# Patient Record
Sex: Male | Born: 1955 | Race: White | Hispanic: No | Smoking: Current some day smoker
Health system: Southern US, Community
[De-identification: ages and names within clinical notes are randomized; demographics above are authoritative.]

## PROBLEM LIST (undated history)

## (undated) DIAGNOSIS — M1990 Unspecified osteoarthritis, unspecified site: Secondary | ICD-10-CM

## (undated) HISTORY — DX: Unspecified osteoarthritis, unspecified site: M19.90

---

## 1973-09-15 HISTORY — PX: FACIAL LACERATION REPAIR: SHX6589

## 2000-05-13 ENCOUNTER — Ambulatory Visit (HOSPITAL_COMMUNITY): Admission: RE | Admit: 2000-05-13 | Discharge: 2000-05-13 | Payer: Self-pay | Admitting: Gastroenterology

## 2004-06-01 ENCOUNTER — Emergency Department (HOSPITAL_COMMUNITY): Admission: EM | Admit: 2004-06-01 | Discharge: 2004-06-01 | Payer: Self-pay | Admitting: Emergency Medicine

## 2005-09-30 IMAGING — CT CT HEAD W/O CM
1 of 2 series · 13 of 30 positions shown, 17 images · non-contrast
Comparison: none

CLINICAL DATA: Head injury. 
 CT OF THE HEAD WITHOUT IV CONTRAST
 There are no midline shift or mass effect, and the ventricles are normal in size and contour.  There is no CT scan evidence for recent stroke or hemorrhage.  There is mild mucosal thickening associated with the left maxillary sinus. 
 IMPRESSION
 Mild mucosal thickening associated with the left maxillary sinus.  Otherwise normal study.

[Series 3: — · axial · 0.45mm/px · z∈[-120,-0]mm · 13 of 28 slices shown, 17 images]
[im 2/28  brain]
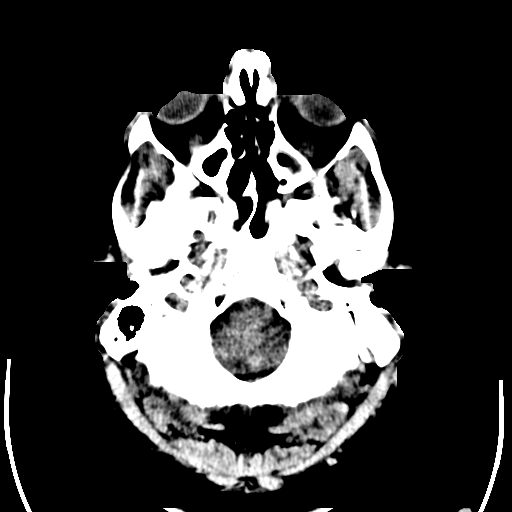
[im 2/28  bone]
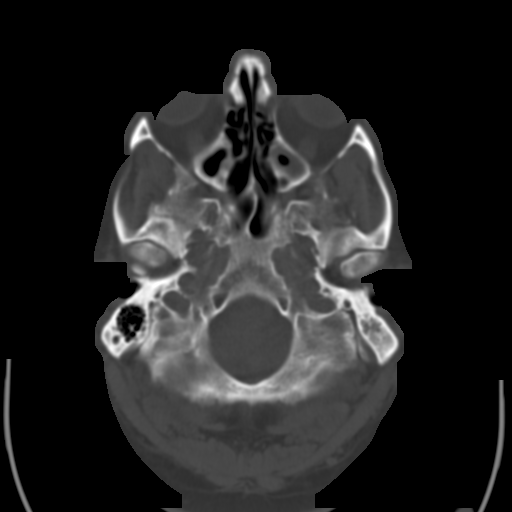
[im 4/28  brain]
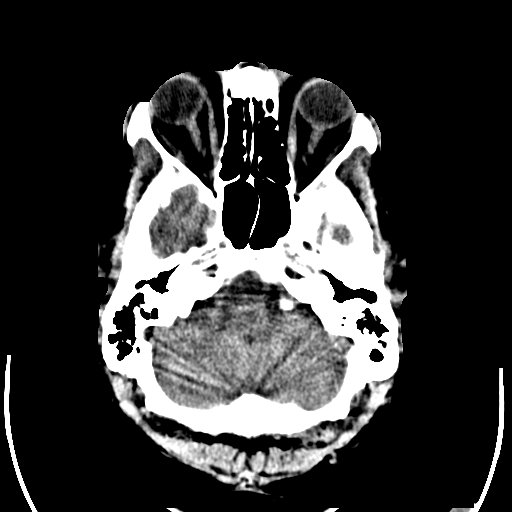
[im 6/28  brain]
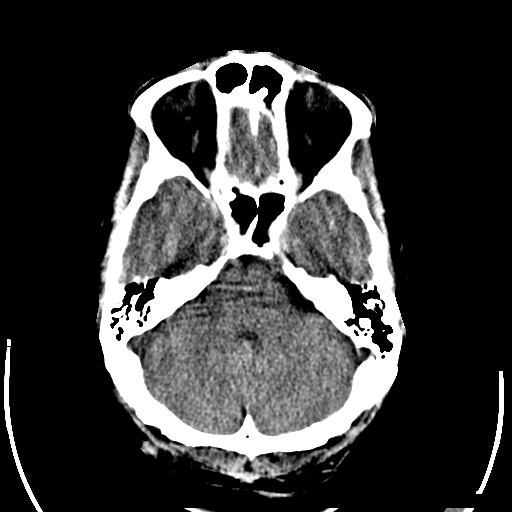
[im 8/28  brain]
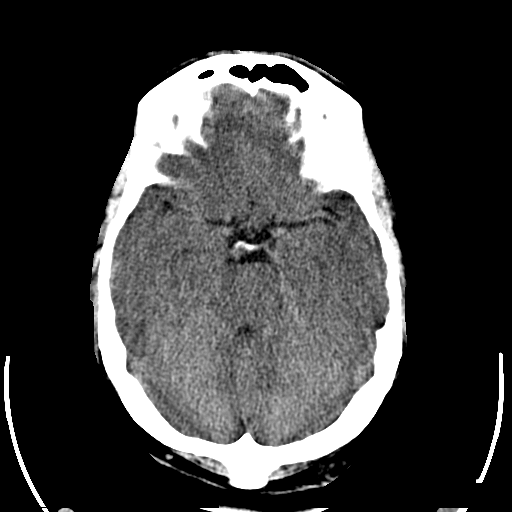
[im 10/28  brain]
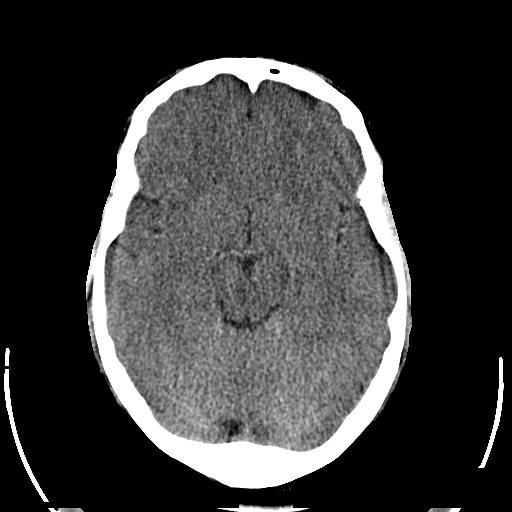
[im 10/28  bone]
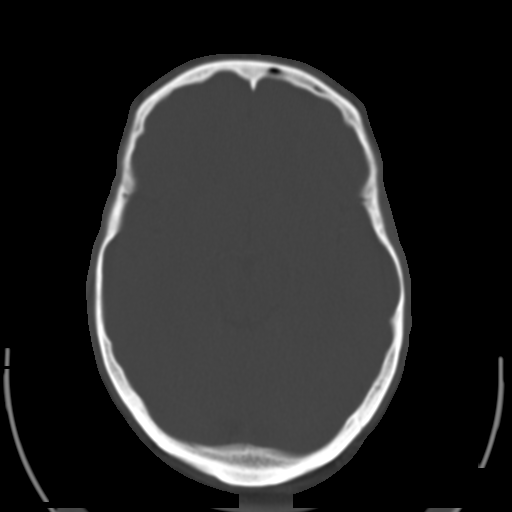
[im 12/28  brain]
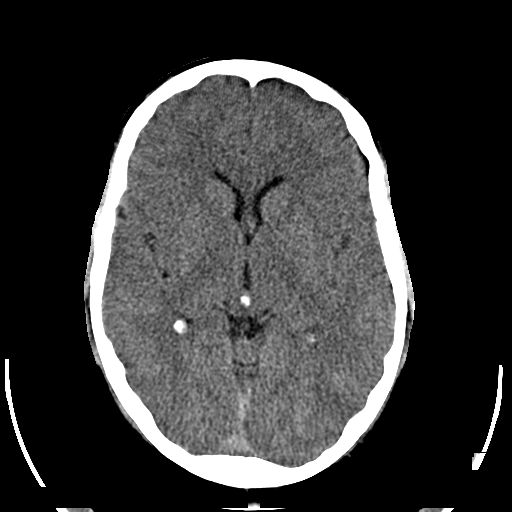
[im 14/28  brain]
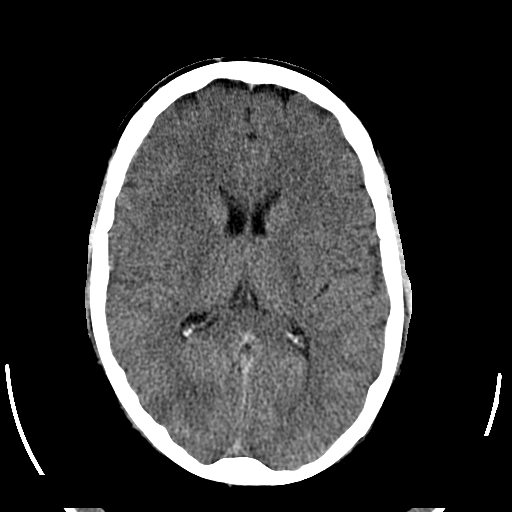
[im 16/28  brain]
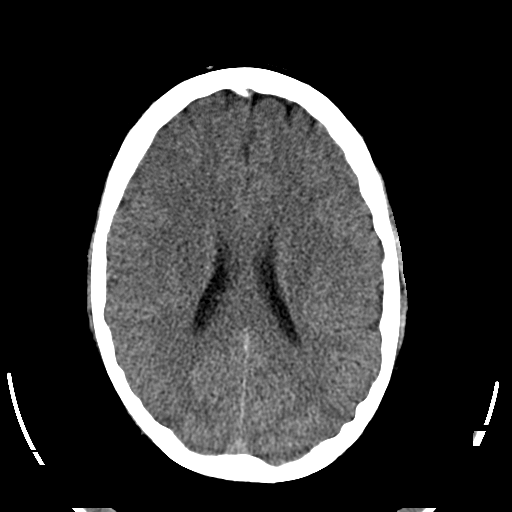
[im 18/28  brain]
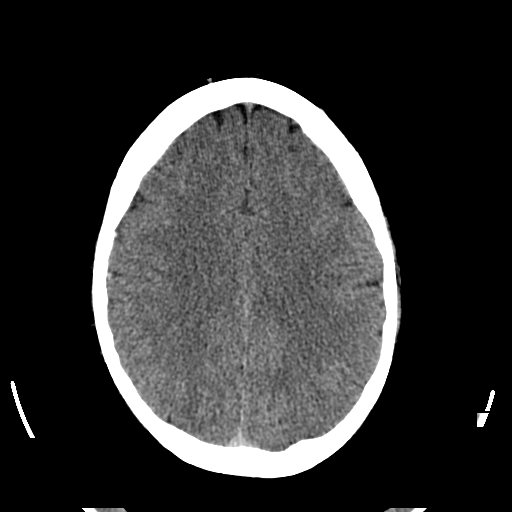
[im 18/28  bone]
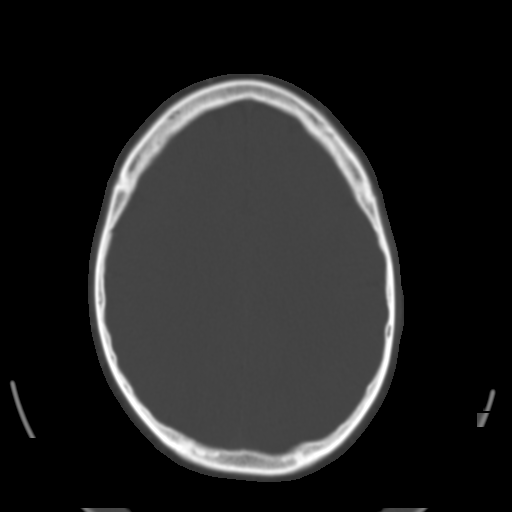
[im 20/28  brain]
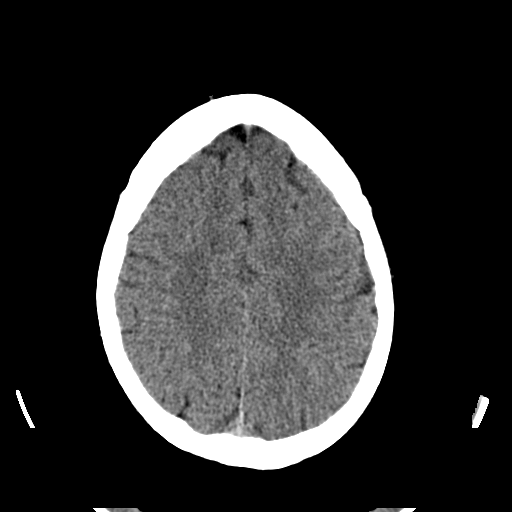
[im 22/28  brain]
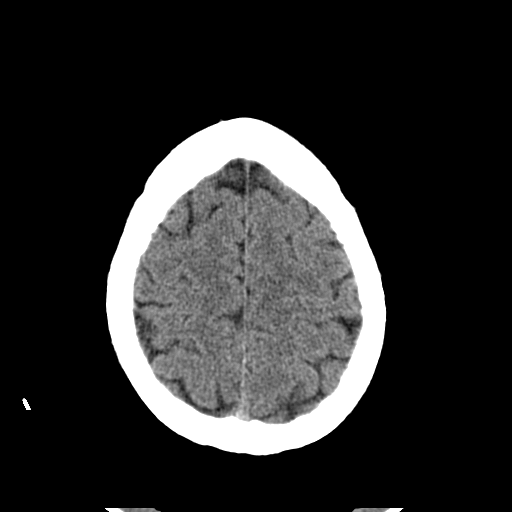
[im 24/28  brain]
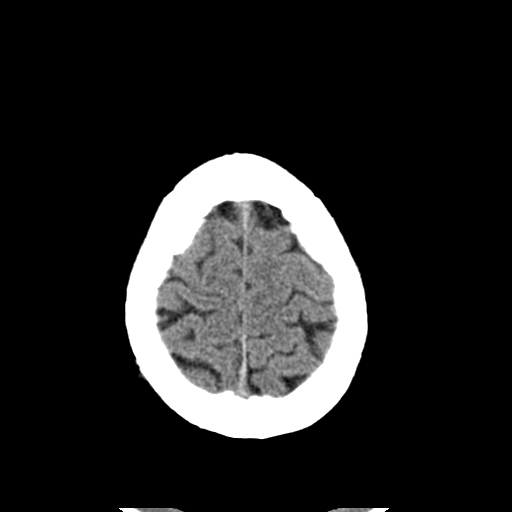
[im 26/28  brain]
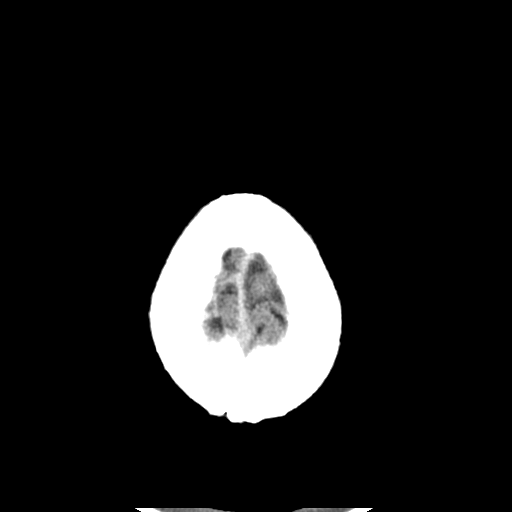
[im 26/28  bone]
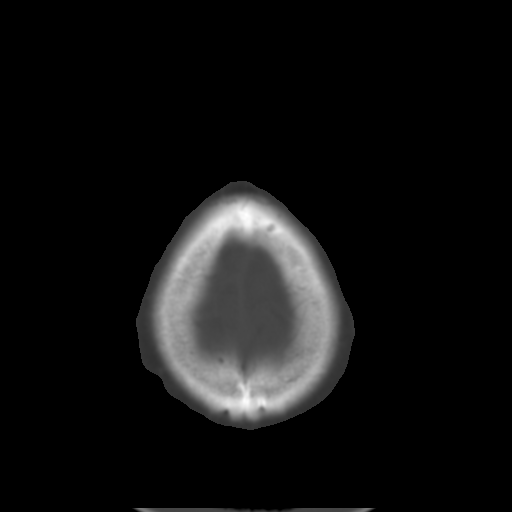

[13 of 30 positions shown; findings below may reference images not displayed]

## 2006-12-01 ENCOUNTER — Ambulatory Visit: Payer: Self-pay | Admitting: Gastroenterology

## 2006-12-07 ENCOUNTER — Ambulatory Visit: Payer: Self-pay | Admitting: Gastroenterology

## 2009-10-16 ENCOUNTER — Encounter (INDEPENDENT_AMBULATORY_CARE_PROVIDER_SITE_OTHER): Payer: Self-pay | Admitting: *Deleted

## 2010-10-15 NOTE — Letter (Signed)
Summary: Colonoscopy Date Change Letter  [facility]  [address] [place], [place] [address]   Phone: [phone]  Fax: [phone]      October 16, 2009 MRN: [id number]   Ernest Thornton [address] [place], [place]  [address]   Dear Ernest Thornton,   Previously you were recommended to have a repeat colonoscopy around this time. Your chart was recently reviewed by Dr. Vania Rea. Jarold Motto of [facility]. Follow up colonoscopy is now recommended in March 2013. This revised recommendation is based on current, nationally recognized guidelines for colorectal cancer screening and polyp surveillance. These guidelines are endorsed by the American Cancer Society, [employer] on Colorectal Cancer as well as numerous other major medical organizations.  Please understand that our recommendation assumes that you do not have any new symptoms such as bleeding, a change in bowel habits, anemia, or significant abdominal discomfort. If you do have any concerning GI symptoms or want to discuss the guideline recommendations, please call to arrange an office visit at your earliest convenience. Otherwise we will keep you in our reminder system and contact you 1-2 months prior to the date listed above to schedule your next colonoscopy.  Thank you,  Vania Rea. Jarold Motto, M.D.  [facility] [phone]

## 2011-12-08 ENCOUNTER — Encounter: Payer: Self-pay | Admitting: Gastroenterology

## 2011-12-29 ENCOUNTER — Encounter: Payer: Self-pay | Admitting: Gastroenterology

## 2012-01-21 ENCOUNTER — Ambulatory Visit (AMBULATORY_SURGERY_CENTER): Payer: No Typology Code available for payment source | Admitting: *Deleted

## 2012-01-21 VITALS — Ht 72.0 in | Wt 264.0 lb

## 2012-01-21 DIAGNOSIS — Z1211 Encounter for screening for malignant neoplasm of colon: Secondary | ICD-10-CM

## 2012-01-21 MED ORDER — PEG-KCL-NACL-NASULF-NA ASC-C 100 G PO SOLR: ORAL | Status: DC

## 2012-02-04 ENCOUNTER — Ambulatory Visit (AMBULATORY_SURGERY_CENTER): Payer: No Typology Code available for payment source | Admitting: Gastroenterology

## 2012-02-04 ENCOUNTER — Encounter: Payer: Self-pay | Admitting: Gastroenterology

## 2012-02-04 VITALS — BP 140/92 | HR 78 | Temp 96.2°F | Resp 20 | Ht 72.0 in | Wt 264.0 lb

## 2012-02-04 DIAGNOSIS — Z860100 Personal history of colon polyps, unspecified: Secondary | ICD-10-CM

## 2012-02-04 DIAGNOSIS — Z1211 Encounter for screening for malignant neoplasm of colon: Secondary | ICD-10-CM

## 2012-02-04 DIAGNOSIS — Z800 Family history of malignant neoplasm of digestive organs: Secondary | ICD-10-CM

## 2012-02-04 MED ORDER — SODIUM CHLORIDE 0.9 % IV SOLN: 500.0000 mL | INTRAVENOUS | Status: DC

## 2012-02-04 NOTE — Op Note (Signed)
[facility] [address] [place], [place]  [address]  COLONOSCOPY PROCEDURE REPORT  PATIENT:  Ernest Thornton, Ernest Thornton  MR#:  [id number] BIRTHDATE:  [birth year], 56 yrs. old  GENDER:  male ENDOSCOPIST:  Vania Rea. Jarold Motto, MD, [facility] REF. BY: PROCEDURE DATE:  02/04/2012 PROCEDURE:  Diagnostic Colonoscopy ASA CLASS:  Class II INDICATIONS:  history of pre-cancerous (adenomatous) colon polyps father with colon cancer MEDICATIONS:   propofol (Diprivan) 175 mg IV  DESCRIPTION OF PROCEDURE:   After the risks and benefits and of the procedure were explained, informed consent was obtained. Digital rectal exam was performed and revealed no abnormalities. The LB [id number]-[id number] endoscope was introduced through the anus and advanced to the cecum, which was identified by both the appendix and ileocecal valve.  The quality of the prep was excellent, using MoviPrep.  The instrument was then slowly withdrawn as the colon was fully examined. <<PROCEDUREIMAGES>>  FINDINGS:  No polyps or cancers were seen. diminutive hyperplastic nodules noted.  This was otherwise a normal examination of the colon.   Retroflexed views in the rectum revealed no abnormalities.    The scope was then withdrawn from the patient and the procedure completed.  COMPLICATIONS:  None ENDOSCOPIC IMPRESSION: 1) No polyps or cancers 2) Otherwise normal examination RECOMMENDATIONS: 1) Repeat Colonoscopy in 5 years. 2) Continue current medications  REPEAT EXAM:  No  ______________________________ Vania Rea. Jarold Motto, MD, [facility]  CC:  n. eSIGNED:   Vania Rea. Alaia Lordi at 02/04/2012 09:43 AM  Narda Bonds, [id number]

## 2012-02-04 NOTE — Progress Notes (Signed)
Patient did not experience any of the following events: a burn prior to discharge; a fall within the facility; wrong site/side/patient/procedure/implant event; or a hospital transfer or hospital admission upon discharge from the facility. (G8907) Patient did not have preoperative order for IV antibiotic SSI prophylaxis. (G8918)  

## 2012-02-04 NOTE — Patient Instructions (Signed)
Impressions/Recommendations:  Normal colonoscopy.  Repeat colonoscopy in 5 years.  YOU HAD AN ENDOSCOPIC PROCEDURE TODAY AT THE [facility]: Refer to the procedure report that was given to you for any specific questions about what was found during the examination.  If the procedure report does not answer your questions, please call your gastroenterologist to clarify.  If you requested that your care partner not be given the details of your procedure findings, then the procedure report has been included in a sealed envelope for you to review at your convenience later.  YOU SHOULD EXPECT: Some feelings of bloating in the abdomen. Passage of more gas than usual.  Walking can help get rid of the air that was put into your GI tract during the procedure and reduce the bloating. If you had a lower endoscopy (such as a colonoscopy or flexible sigmoidoscopy) you may notice spotting of blood in your stool or on the toilet paper. If you underwent a bowel prep for your procedure, then you may not have a normal bowel movement for a few days.  DIET: Your first meal following the procedure should be a light meal and then it is ok to progress to your normal diet.  A half-sandwich or bowl of soup is an example of a good first meal.  Heavy or fried foods are harder to digest and may make you feel nauseous or bloated.  Likewise meals heavy in dairy and vegetables can cause extra gas to form and this can also increase the bloating.  Drink plenty of fluids but you should avoid alcoholic beverages for 24 hours.  ACTIVITY: Your care partner should take you home directly after the procedure.  You should plan to take it easy, moving slowly for the rest of the day.  You can resume normal activity the day after the procedure however you should NOT DRIVE or use heavy machinery for 24 hours (because of the sedation medicines used during the test).    SYMPTOMS TO REPORT IMMEDIATELY: A gastroenterologist can be reached  at any hour.  During normal business hours, 8:30 AM to 5:00 PM Monday through Friday, call [phone].  After hours and on weekends, please call the GI answering service at [phone] who will take a message and have the physician on call contact you.   Following lower endoscopy (colonoscopy or flexible sigmoidoscopy):  Excessive amounts of blood in the stool  Significant tenderness or worsening of abdominal pains  Swelling of the abdomen that is new, acute  Fever of 100F or higher  Following upper endoscopy (EGD)  Vomiting of blood or coffee ground material  New chest pain or pain under the shoulder blades  Painful or persistently difficult swallowing  New shortness of breath  Fever of 100F or higher  Black, tarry-looking stools  FOLLOW UP: If any biopsies were taken you will be contacted by phone or by letter within the next 1-3 weeks.  Call your gastroenterologist if you have not heard about the biopsies in 3 weeks.  Our staff will call the home number listed on your records the next business day following your procedure to check on you and address any questions or concerns that you may have at that time regarding the information given to you following your procedure. This is a courtesy call and so if there is no answer at the home number and we have not heard from you through the emergency physician on call, we will assume that you have returned to your  regular daily activities without incident.  SIGNATURES/CONFIDENTIALITY: You and/or your care partner have signed paperwork which will be entered into your electronic medical record.  These signatures attest to the fact that that the information above on your After Visit Summary has been reviewed and is understood.  Full responsibility of the confidentiality of this discharge information lies with you and/or your care-partner. 

## 2012-02-04 NOTE — Progress Notes (Addendum)
Propofol given and 0xygen managed per Paulita Cradle CRNA  Abdominal pressure applied per [place]

## 2012-02-05 ENCOUNTER — Telehealth: Payer: Self-pay

## 2012-02-05 NOTE — Telephone Encounter (Signed)
  Follow up Call-  Call back number 02/04/2012  Post procedure Call Back phone  # [phone]  Permission to leave phone message Yes     Patient questions:  Do you have a fever, pain , or abdominal swelling? no Pain Score  0 *  Have you tolerated food without any problems? yes  Have you been able to return to your normal activities? yes  Do you have any questions about your discharge instructions: Diet   no Medications  no Follow up visit  no  Do you have questions or concerns about your Care? no  Actions: * If pain score is 4 or above: No action needed, pain <4.

## 2015-03-09 ENCOUNTER — Encounter: Payer: Self-pay | Admitting: Gastroenterology

## 2016-11-13 ENCOUNTER — Encounter: Payer: Self-pay | Admitting: *Deleted

## 2016-12-11 ENCOUNTER — Encounter: Payer: Self-pay | Admitting: Gastroenterology

## 2017-05-05 ENCOUNTER — Encounter: Payer: Self-pay | Admitting: Gastroenterology

## 2017-06-02 ENCOUNTER — Ambulatory Visit (AMBULATORY_SURGERY_CENTER): Payer: Self-pay | Admitting: *Deleted

## 2017-06-02 VITALS — Ht 72.0 in | Wt 243.6 lb

## 2017-06-02 DIAGNOSIS — Z86010 Personal history of colonic polyps: Secondary | ICD-10-CM

## 2017-06-02 DIAGNOSIS — Z800 Family history of malignant neoplasm of digestive organs: Secondary | ICD-10-CM

## 2017-06-02 MED ORDER — NA SULFATE-K SULFATE-MG SULF 17.5-3.13-1.6 GM/177ML PO SOLN: ORAL | 0 refills | Status: AC

## 2017-06-02 NOTE — Progress Notes (Signed)
No allergies to eggs or soy. No problems with anesthesia.  Pt given Emmi instructions for colonoscopy  No oxygen use  No diet drug use  

## 2017-06-03 ENCOUNTER — Encounter: Payer: Self-pay | Admitting: Gastroenterology

## 2017-06-16 ENCOUNTER — Encounter: Payer: 59 | Admitting: Gastroenterology
